# Patient Record
Sex: Male | Born: 1985 | Hispanic: Yes | Marital: Single | Smoking: Current every day smoker
Health system: Southern US, Community
[De-identification: ages and names within clinical notes are randomized; demographics above are authoritative.]

---

## 2013-05-15 ENCOUNTER — Ambulatory Visit: Payer: Self-pay | Admitting: Family Medicine

## 2013-05-15 VITALS — BP 120/80 | HR 89 | Temp 98.0°F | Resp 18 | Ht 67.0 in | Wt 123.0 lb

## 2013-05-15 DIAGNOSIS — R369 Urethral discharge, unspecified: Secondary | ICD-10-CM

## 2013-05-15 DIAGNOSIS — R300 Dysuria: Secondary | ICD-10-CM

## 2013-05-15 LAB — POCT URINALYSIS DIPSTICK
Bilirubin, UA: NEGATIVE
Protein, UA: NEGATIVE
pH, UA: 7.5

## 2013-05-15 LAB — POCT UA - MICROSCOPIC ONLY
Casts, Ur, LPF, POC: NEGATIVE
Crystals, Ur, HPF, POC: NEGATIVE
Epithelial cells, urine per micros: NEGATIVE

## 2013-05-15 MED ORDER — CEFTRIAXONE SODIUM 1 G IJ SOLR
1.0000 g | INTRAMUSCULAR | Status: AC
  Administered 2013-05-15: 1 g via INTRAMUSCULAR

## 2013-05-15 MED ORDER — DOXYCYCLINE HYCLATE 100 MG PO CAPS: 100.0000 mg | ORAL_CAPSULE | Freq: Two times a day (BID) | ORAL | Status: AC

## 2013-05-15 NOTE — Progress Notes (Signed)
Subjective:    Patient ID: Larry Moran, male    DOB: October 09, 1986, 27 y.o.   MRN: [id number]  HPI This 27 y.o. male presents for evaluation of penile pain and .  Onset two days ago.  No fever/chills/sweats.  +dysuria.  +frequency  No n/v.  Nocturia x 1-2; baseline x 0.  Penile discharge yes yesterday.  Testicular pain none.  Sexually active not recently; last sexual activity fifteen days ago.  New partner.  No history of STD.  Females only.  Dry board/painting/dry wall.  From [place].     Review of Systems  Constitutional: Negative for chills, diaphoresis and fatigue.  Gastrointestinal: Negative for nausea, vomiting and abdominal pain.  Genitourinary: Positive for dysuria, urgency, frequency, discharge and penile pain. Negative for hematuria, flank pain, penile swelling, scrotal swelling, genital sores and testicular pain.    History reviewed. No pertinent past medical history.  History reviewed. No pertinent past surgical history.  Prior to Admission medications   Not on File    No Known Allergies  History   Social History  . Marital Status: Single    Spouse Name: N/A    Number of Children: N/A  . Years of Education: N/A   Occupational History  . Not on file.   Social History Main Topics  . Smoking status: Not on file  . Smokeless tobacco: Not on file  . Alcohol Use: Not on file  . Drug Use: Not on file  . Sexually Active: Not on file   Other Topics Concern  . Not on file   Social History Narrative  . No narrative on file    Family History  Problem Relation Age of Onset  . Hyperlipidemia Mother        Objective:   Physical Exam  Nursing note and vitals reviewed. Constitutional: He is oriented to person, place, and time. He appears well-developed and well-nourished.  HENT:  Head: Normocephalic and atraumatic.  Cardiovascular: Normal rate, regular rhythm and normal heart sounds.   Pulmonary/Chest: Effort normal and breath sounds normal. He has no wheezes.  He has no rales.  Abdominal: Soft. Bowel sounds are normal. He exhibits no distension and no mass. There is no tenderness. There is no rebound and no guarding. Hernia confirmed negative in the right inguinal area and confirmed negative in the left inguinal area.  Genitourinary: Testes normal. Right testis shows no mass, no swelling and no tenderness. Left testis shows no mass, no swelling and no tenderness. Uncircumcised. No penile erythema. Discharge found.  Lymphadenopathy:       Right: No inguinal adenopathy present.       Left: Inguinal adenopathy present.  Neurological: He is alert and oriented to person, place, and time.  Psychiatric: He has a normal mood and affect. His behavior is normal.      Assessment & Plan:  Penile discharge - Plan: POCT UA - Microscopic Only, POCT urinalysis dipstick, GC/Chlamydia Probe Amp, Urine culture, cefTRIAXone (ROCEPHIN) injection 1 g  Dysuria - Plan: POCT UA - Microscopic Only, POCT urinalysis dipstick, GC/Chlamydia Probe Amp, Urine culture, cefTRIAXone (ROCEPHIN) injection 1 g  1. Penile discharge:  New.  Send uriprobe.  S/p Rocephin 1 gram IM; rx for Doxycycline provided.   2.  Dysuria:  New.  Send urine culture; associated with penile discharge; s/p Rocephin; rx for Doxy.  Meds ordered this encounter  Medications  . cefTRIAXone (ROCEPHIN) injection 1 g    Sig:   . doxycycline (VIBRAMYCIN) 100 MG capsule  Sig: Take 1 capsule (100 mg total) by mouth 2 (two) times daily.    Dispense:  14 capsule    Refill:  0    SPANISH LABELS

## 2013-05-16 LAB — URINE CULTURE
Colony Count: NO GROWTH
Organism ID, Bacteria: NO GROWTH

## 2015-08-22 ENCOUNTER — Emergency Department (HOSPITAL_COMMUNITY): Payer: Self-pay

## 2015-08-22 ENCOUNTER — Encounter (HOSPITAL_COMMUNITY): Payer: Self-pay | Admitting: Radiology

## 2015-08-22 ENCOUNTER — Emergency Department (HOSPITAL_COMMUNITY)
Admission: EM | Admit: 2015-08-22 | Discharge: 2015-08-22 | Disposition: A | Discharge status: Home / Self Care | Payer: Self-pay

## 2015-08-22 ENCOUNTER — Observation Stay (HOSPITAL_COMMUNITY)
Admission: EM | Admit: 2015-08-22 | Discharge: 2015-08-23 | Disposition: A | Discharge status: Home / Self Care | Payer: Self-pay | Attending: Surgery | Admitting: Surgery

## 2015-08-22 ENCOUNTER — Observation Stay (HOSPITAL_BASED_OUTPATIENT_CLINIC_OR_DEPARTMENT_OTHER): Payer: Self-pay

## 2015-08-22 DIAGNOSIS — R0602 Shortness of breath: Secondary | ICD-10-CM | POA: Insufficient documentation

## 2015-08-22 DIAGNOSIS — S298XXA Other specified injuries of thorax, initial encounter: Secondary | ICD-10-CM

## 2015-08-22 DIAGNOSIS — W3400XA Accidental discharge from unspecified firearms or gun, initial encounter: Secondary | ICD-10-CM | POA: Insufficient documentation

## 2015-08-22 DIAGNOSIS — R079 Chest pain, unspecified: Secondary | ICD-10-CM

## 2015-08-22 DIAGNOSIS — F17200 Nicotine dependence, unspecified, uncomplicated: Secondary | ICD-10-CM | POA: Insufficient documentation

## 2015-08-22 DIAGNOSIS — T1490XA Injury, unspecified, initial encounter: Secondary | ICD-10-CM

## 2015-08-22 DIAGNOSIS — S21139A Puncture wound without foreign body of unspecified front wall of thorax without penetration into thoracic cavity, initial encounter: Secondary | ICD-10-CM

## 2015-08-22 DIAGNOSIS — S21132A Puncture wound without foreign body of left front wall of thorax without penetration into thoracic cavity, initial encounter: Principal | ICD-10-CM | POA: Insufficient documentation

## 2015-08-22 LAB — COMPREHENSIVE METABOLIC PANEL
ALBUMIN: 4 g/dL (ref 3.5–5.0)
ALT: 20 U/L (ref 17–63)
ANION GAP: 12 (ref 5–15)
AST: 21 U/L (ref 15–41)
Alkaline Phosphatase: 72 U/L (ref 38–126)
BUN: 8 mg/dL (ref 6–20)
CALCIUM: 9.6 mg/dL (ref 8.9–10.3)
CO2: 26 mmol/L (ref 22–32)
Chloride: 103 mmol/L (ref 101–111)
Creatinine, Ser: 1.04 mg/dL (ref 0.61–1.24)
GFR calc non Af Amer: >60 mL/min (ref >60)
GLUCOSE: 98 mg/dL (ref 65–99)
POTASSIUM: 3 mmol/L — AB (ref 3.5–5.1)
SODIUM: 141 mmol/L (ref 135–145)
TOTAL PROTEIN: 6.8 g/dL (ref 6.5–8.1)
Total Bilirubin: 0.4 mg/dL (ref 0.3–1.2)

## 2015-08-22 LAB — CBC
HEMATOCRIT: 42.8 % (ref 39.0–52.0)
HEMOGLOBIN: 14.3 g/dL (ref 13.0–17.0)
MCH: 30.8 pg (ref 26.0–34.0)
MCHC: 33.4 g/dL (ref 30.0–36.0)
MCV: 92.2 fL (ref 78.0–100.0)
Platelets: 297 10*3/uL (ref 150–400)
RBC: 4.64 MIL/uL (ref 4.22–5.81)
RDW: 12.3 % (ref 11.5–15.5)
WBC: 14.4 10*3/uL — ABNORMAL HIGH (ref 4.0–10.5)

## 2015-08-22 LAB — PROTIME-INR
INR: 0.93 (ref 0.00–1.49)
PROTHROMBIN TIME: 12.7 s (ref 11.6–15.2)

## 2015-08-22 LAB — PREPARE FRESH FROZEN PLASMA
UNIT DIVISION: 0
UNIT DIVISION: 0
UNIT DIVISION: 0
Unit division: 0

## 2015-08-22 LAB — ETHANOL: ALCOHOL ETHYL (B): 93 mg/dL — AB (ref <5)

## 2015-08-22 LAB — CDS SEROLOGY

## 2015-08-22 LAB — ABO/RH: ABO/RH(D): O POS

## 2015-08-22 MED ORDER — BISACODYL 10 MG RE SUPP: 10.0000 mg | Freq: Every day | RECTAL | Status: DC | PRN

## 2015-08-22 MED ORDER — MORPHINE SULFATE (PF) 4 MG/ML IV SOLN
INTRAVENOUS | Status: AC
  Filled 2015-08-22: qty 1

## 2015-08-22 MED ORDER — TETANUS-DIPHTHERIA TOXOIDS TD 5-2 LFU IM INJ
0.5000 mL | INJECTION | Freq: Once | INTRAMUSCULAR | Status: DC
  Filled 2015-08-22: qty 0.5

## 2015-08-22 MED ORDER — TETANUS-DIPHTH-ACELL PERTUSSIS 5-2.5-18.5 LF-MCG/0.5 IM SUSP
0.5000 mL | Freq: Once | INTRAMUSCULAR | Status: AC
  Administered 2015-08-22: 0.5 mL via INTRAMUSCULAR

## 2015-08-22 MED ORDER — IOHEXOL 300 MG/ML  SOLN
100.0000 mL | Freq: Once | INTRAMUSCULAR | Status: AC | PRN
  Administered 2015-08-22: 100 mL via INTRAVENOUS

## 2015-08-22 MED ORDER — SODIUM CHLORIDE 0.9 % IV SOLN
INTRAVENOUS | Status: AC | PRN
Start: 2015-08-22 — End: 2015-08-22
  Administered 2015-08-22: 1000 mL via INTRAVENOUS

## 2015-08-22 MED ORDER — KCL IN DEXTROSE-NACL 20-5-0.45 MEQ/L-%-% IV SOLN
INTRAVENOUS | Status: DC
  Administered 2015-08-22: 23:00:00 via INTRAVENOUS
  Filled 2015-08-22 (×2): qty 1000

## 2015-08-22 MED ORDER — MORPHINE SULFATE 2 MG/ML IJ SOLN
INTRAMUSCULAR | Status: AC | PRN
  Administered 2015-08-22 (×2): 4 mg via INTRAVENOUS

## 2015-08-22 MED ORDER — MORPHINE SULFATE (PF) 2 MG/ML IV SOLN
INTRAVENOUS | Status: AC
  Filled 2015-08-22: qty 2

## 2015-08-22 MED ORDER — HYDROMORPHONE HCL 1 MG/ML IJ SOLN
0.5000 mg | INTRAMUSCULAR | Status: DC | PRN
  Administered 2015-08-23: 1 mg via INTRAVENOUS
  Filled 2015-08-22: qty 1

## 2015-08-22 MED ORDER — DOCUSATE SODIUM 100 MG PO CAPS
100.0000 mg | ORAL_CAPSULE | Freq: Two times a day (BID) | ORAL | Status: DC
  Administered 2015-08-23: 100 mg via ORAL
  Filled 2015-08-22: qty 1
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
## 2015-08-22 NOTE — ED Notes (Signed)
Family at bedside. 
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
## 2015-08-22 NOTE — Progress Notes (Signed)
Echocardiogram 2D Echocardiogram has been performed.  Nolon RodBrown, Tony 08/22/2015, 11:37 PM
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
## 2015-08-23 ENCOUNTER — Observation Stay (HOSPITAL_COMMUNITY): Payer: Self-pay

## 2015-08-23 LAB — TYPE AND SCREEN
ABO/RH(D): O POS
ANTIBODY SCREEN: NEGATIVE
UNIT DIVISION: 0
Unit division: 0

## 2015-08-23 LAB — BASIC METABOLIC PANEL
Anion gap: 11 (ref 5–15)
BUN: 6 mg/dL (ref 6–20)
CHLORIDE: 105 mmol/L (ref 101–111)
CO2: 24 mmol/L (ref 22–32)
Calcium: 8.8 mg/dL — ABNORMAL LOW (ref 8.9–10.3)
Creatinine, Ser: 0.84 mg/dL (ref 0.61–1.24)
GFR calc non Af Amer: >60 mL/min (ref >60)
Glucose, Bld: 111 mg/dL — ABNORMAL HIGH (ref 65–99)
POTASSIUM: 3.4 mmol/L — AB (ref 3.5–5.1)
SODIUM: 140 mmol/L (ref 135–145)

## 2015-08-23 LAB — CBC
HEMATOCRIT: 42.2 % (ref 39.0–52.0)
HEMOGLOBIN: 14.2 g/dL (ref 13.0–17.0)
MCH: 31.3 pg (ref 26.0–34.0)
MCHC: 33.6 g/dL (ref 30.0–36.0)
MCV: 93 fL (ref 78.0–100.0)
Platelets: 265 10*3/uL (ref 150–400)
RBC: 4.54 MIL/uL (ref 4.22–5.81)
RDW: 12.5 % (ref 11.5–15.5)
WBC: 17.6 10*3/uL — ABNORMAL HIGH (ref 4.0–10.5)

## 2015-08-23 LAB — BLOOD PRODUCT ORDER (VERBAL) VERIFICATION

## 2015-08-23 LAB — MRSA PCR SCREENING: MRSA by PCR: NEGATIVE

## 2015-08-23 MED ORDER — POTASSIUM CHLORIDE CRYS ER 20 MEQ PO TBCR
20.0000 meq | EXTENDED_RELEASE_TABLET | Freq: Two times a day (BID) | ORAL | Status: DC
  Administered 2015-08-23 (×2): 20 meq via ORAL
  Filled 2015-08-23 (×4): qty 1

## 2015-08-23 MED ORDER — NAPROXEN 250 MG PO TABS
500.0000 mg | ORAL_TABLET | Freq: Two times a day (BID) | ORAL | Status: DC
Start: 2015-08-23 — End: 2015-08-23

## 2015-08-23 MED ORDER — HYDROCODONE-ACETAMINOPHEN 10-325 MG PO TABS: 0.5000 | ORAL_TABLET | ORAL | Status: DC | PRN
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
## 2015-08-23 NOTE — Discharge Instructions (Signed)
Wash wounds daily in shower with soap and water. Do not soak. Apply antibiotic ointment (e.g. Neosporin) twice daily and as needed to keep moist. Cover with dry dressing.  Use ibuprofen or tylenol for pain.
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
